# Patient Record
Sex: Female | Born: 1945 | Race: White | Hispanic: No | Smoking: Never smoker
Health system: Southern US, Community
[De-identification: ages and names within clinical notes are randomized; demographics above are authoritative.]

## PROBLEM LIST (undated history)

## (undated) DIAGNOSIS — I10 Essential (primary) hypertension: Secondary | ICD-10-CM

## (undated) DIAGNOSIS — N1832 Chronic kidney disease, stage 3b: Secondary | ICD-10-CM

## (undated) DIAGNOSIS — R7303 Prediabetes: Secondary | ICD-10-CM

## (undated) DIAGNOSIS — I509 Heart failure, unspecified: Secondary | ICD-10-CM

## (undated) DIAGNOSIS — E559 Vitamin D deficiency, unspecified: Secondary | ICD-10-CM

## (undated) DIAGNOSIS — N2581 Secondary hyperparathyroidism of renal origin: Secondary | ICD-10-CM

## (undated) DIAGNOSIS — E785 Hyperlipidemia, unspecified: Secondary | ICD-10-CM

## (undated) DIAGNOSIS — I447 Left bundle-branch block, unspecified: Secondary | ICD-10-CM

## (undated) DIAGNOSIS — R739 Hyperglycemia, unspecified: Secondary | ICD-10-CM

## (undated) DIAGNOSIS — I712 Thoracic aortic aneurysm, without rupture, unspecified: Secondary | ICD-10-CM

## (undated) HISTORY — PX: TRANSCATHETER AORTIC VALVE REPLACEMENT, CAROTID: SHX6798

## (undated) HISTORY — PX: INSERT / REPLACE / REMOVE PACEMAKER: SUR710

---

## 2010-07-16 ENCOUNTER — Ambulatory Visit: Payer: Self-pay | Admitting: Unknown Physician Specialty

## 2010-08-22 ENCOUNTER — Ambulatory Visit: Payer: Self-pay | Admitting: Gastroenterology

## 2020-10-24 ENCOUNTER — Other Ambulatory Visit: Payer: Self-pay | Admitting: Internal Medicine

## 2020-10-24 DIAGNOSIS — Z1231 Encounter for screening mammogram for malignant neoplasm of breast: Secondary | ICD-10-CM

## 2020-11-21 ENCOUNTER — Ambulatory Visit
Admission: RE | Admit: 2020-11-21 | Discharge: 2020-11-21 | Disposition: A | Discharge status: Home / Self Care | Payer: Medicare Other | Source: Ambulatory Visit | Attending: Internal Medicine | Admitting: Internal Medicine

## 2020-11-21 ENCOUNTER — Other Ambulatory Visit: Payer: Self-pay

## 2020-11-21 DIAGNOSIS — Z1231 Encounter for screening mammogram for malignant neoplasm of breast: Secondary | ICD-10-CM | POA: Diagnosis present

## 2021-03-29 ENCOUNTER — Other Ambulatory Visit: Payer: Self-pay | Admitting: Internal Medicine

## 2021-03-29 DIAGNOSIS — I712 Thoracic aortic aneurysm, without rupture, unspecified: Secondary | ICD-10-CM

## 2021-04-08 ENCOUNTER — Ambulatory Visit: Admission: RE | Admit: 2021-04-08 | Payer: Medicare Other | Source: Ambulatory Visit

## 2021-05-15 ENCOUNTER — Other Ambulatory Visit: Payer: Self-pay

## 2021-05-15 ENCOUNTER — Ambulatory Visit
Admission: RE | Admit: 2021-05-15 | Discharge: 2021-05-15 | Disposition: A | Discharge status: Home / Self Care | Payer: Medicare Other | Source: Ambulatory Visit | Attending: Internal Medicine | Admitting: Internal Medicine

## 2021-05-15 DIAGNOSIS — I712 Thoracic aortic aneurysm, without rupture, unspecified: Secondary | ICD-10-CM | POA: Diagnosis present

## 2021-05-15 LAB — POCT I-STAT CREATININE: Creatinine, Ser: 1.3 mg/dL — ABNORMAL HIGH (ref 0.44–1.00)

## 2021-05-15 MED ORDER — IOHEXOL 350 MG/ML SOLN
75.0000 mL | Freq: Once | INTRAVENOUS | Status: AC | PRN
  Administered 2021-05-15: 75 mL via INTRAVENOUS

## 2021-10-21 ENCOUNTER — Other Ambulatory Visit: Payer: Self-pay | Admitting: Internal Medicine

## 2021-10-21 DIAGNOSIS — Z1231 Encounter for screening mammogram for malignant neoplasm of breast: Secondary | ICD-10-CM

## 2021-12-12 ENCOUNTER — Ambulatory Visit
Admission: RE | Admit: 2021-12-12 | Discharge: 2021-12-12 | Disposition: A | Discharge status: Home / Self Care | Payer: Medicare Other | Source: Ambulatory Visit | Attending: Internal Medicine | Admitting: Internal Medicine

## 2021-12-12 DIAGNOSIS — Z1231 Encounter for screening mammogram for malignant neoplasm of breast: Secondary | ICD-10-CM | POA: Insufficient documentation

## 2022-05-29 ENCOUNTER — Other Ambulatory Visit: Payer: Self-pay | Admitting: Student

## 2022-05-29 DIAGNOSIS — M48061 Spinal stenosis, lumbar region without neurogenic claudication: Secondary | ICD-10-CM

## 2022-05-29 DIAGNOSIS — M5416 Radiculopathy, lumbar region: Secondary | ICD-10-CM

## 2022-05-29 DIAGNOSIS — M4716 Other spondylosis with myelopathy, lumbar region: Secondary | ICD-10-CM

## 2022-06-19 ENCOUNTER — Other Ambulatory Visit: Payer: Self-pay | Admitting: Student

## 2022-06-19 DIAGNOSIS — M5416 Radiculopathy, lumbar region: Secondary | ICD-10-CM

## 2022-06-19 DIAGNOSIS — M4807 Spinal stenosis, lumbosacral region: Secondary | ICD-10-CM

## 2022-06-19 DIAGNOSIS — M47816 Spondylosis without myelopathy or radiculopathy, lumbar region: Secondary | ICD-10-CM

## 2022-06-26 ENCOUNTER — Other Ambulatory Visit: Payer: Medicare Other

## 2022-07-11 ENCOUNTER — Ambulatory Visit
Admission: RE | Admit: 2022-07-11 | Discharge: 2022-07-11 | Disposition: A | Discharge status: Home / Self Care | Payer: Medicare Other | Source: Ambulatory Visit | Attending: Student | Admitting: Student

## 2022-07-11 DIAGNOSIS — M5416 Radiculopathy, lumbar region: Secondary | ICD-10-CM

## 2022-07-11 DIAGNOSIS — M47816 Spondylosis without myelopathy or radiculopathy, lumbar region: Secondary | ICD-10-CM

## 2022-07-11 DIAGNOSIS — M4807 Spinal stenosis, lumbosacral region: Secondary | ICD-10-CM

## 2022-07-16 ENCOUNTER — Other Ambulatory Visit: Payer: Self-pay | Admitting: Internal Medicine

## 2022-07-16 DIAGNOSIS — I7121 Aneurysm of the ascending aorta, without rupture: Secondary | ICD-10-CM

## 2022-07-30 ENCOUNTER — Other Ambulatory Visit: Payer: Self-pay | Admitting: Internal Medicine

## 2022-07-30 DIAGNOSIS — R079 Chest pain, unspecified: Secondary | ICD-10-CM

## 2022-07-31 ENCOUNTER — Ambulatory Visit: Admission: RE | Admit: 2022-07-31 | Payer: Medicare Other | Source: Ambulatory Visit

## 2022-08-04 ENCOUNTER — Other Ambulatory Visit: Payer: Self-pay | Admitting: Internal Medicine

## 2022-08-04 DIAGNOSIS — I7121 Aneurysm of the ascending aorta, without rupture: Secondary | ICD-10-CM

## 2022-08-06 ENCOUNTER — Ambulatory Visit
Admission: RE | Admit: 2022-08-06 | Discharge: 2022-08-06 | Disposition: A | Discharge status: Home / Self Care | Payer: Medicare Other | Source: Ambulatory Visit | Attending: Internal Medicine | Admitting: Internal Medicine

## 2022-08-06 DIAGNOSIS — I7121 Aneurysm of the ascending aorta, without rupture: Secondary | ICD-10-CM | POA: Insufficient documentation

## 2022-08-06 LAB — POCT I-STAT CREATININE: Creatinine, Ser: 1.4 mg/dL — ABNORMAL HIGH (ref 0.44–1.00)

## 2022-08-06 MED ORDER — IOHEXOL 350 MG/ML SOLN
75.0000 mL | Freq: Once | INTRAVENOUS | Status: AC | PRN
  Administered 2022-08-06: 60 mL via INTRAVENOUS

## 2022-08-07 ENCOUNTER — Inpatient Hospital Stay: Admission: RE | Admit: 2022-08-07 | Payer: Medicare Other | Source: Ambulatory Visit

## 2022-08-29 ENCOUNTER — Other Ambulatory Visit: Payer: Self-pay | Admitting: Internal Medicine

## 2022-08-29 DIAGNOSIS — I059 Rheumatic mitral valve disease, unspecified: Secondary | ICD-10-CM

## 2022-09-02 ENCOUNTER — Ambulatory Visit
Admission: RE | Admit: 2022-09-02 | Discharge: 2022-09-02 | Disposition: A | Discharge status: Home / Self Care | Payer: Medicare Other | Source: Ambulatory Visit | Attending: Internal Medicine | Admitting: Internal Medicine

## 2022-09-02 DIAGNOSIS — I059 Rheumatic mitral valve disease, unspecified: Secondary | ICD-10-CM | POA: Insufficient documentation

## 2022-09-10 ENCOUNTER — Encounter: Payer: Self-pay | Admitting: Internal Medicine

## 2022-09-16 ENCOUNTER — Encounter: Payer: Self-pay | Admitting: Internal Medicine

## 2022-09-17 ENCOUNTER — Encounter
Admission: RE | Disposition: A | Discharge status: Home / Self Care | Payer: Self-pay | Source: Home / Self Care | Attending: Internal Medicine

## 2022-09-17 ENCOUNTER — Other Ambulatory Visit: Payer: Self-pay

## 2022-09-17 ENCOUNTER — Ambulatory Visit
Admission: RE | Admit: 2022-09-17 | Discharge: 2022-09-17 | Disposition: A | Discharge status: Home / Self Care | Payer: Medicare Other | Attending: Internal Medicine | Admitting: Internal Medicine

## 2022-09-17 ENCOUNTER — Ambulatory Visit: Payer: Medicare Other | Admitting: Certified Registered"

## 2022-09-17 ENCOUNTER — Encounter: Payer: Self-pay | Admitting: Internal Medicine

## 2022-09-17 DIAGNOSIS — K5730 Diverticulosis of large intestine without perforation or abscess without bleeding: Secondary | ICD-10-CM | POA: Insufficient documentation

## 2022-09-17 DIAGNOSIS — D120 Benign neoplasm of cecum: Secondary | ICD-10-CM | POA: Diagnosis not present

## 2022-09-17 DIAGNOSIS — D128 Benign neoplasm of rectum: Secondary | ICD-10-CM | POA: Insufficient documentation

## 2022-09-17 DIAGNOSIS — N1832 Chronic kidney disease, stage 3b: Secondary | ICD-10-CM | POA: Diagnosis not present

## 2022-09-17 DIAGNOSIS — I509 Heart failure, unspecified: Secondary | ICD-10-CM | POA: Insufficient documentation

## 2022-09-17 DIAGNOSIS — Z79899 Other long term (current) drug therapy: Secondary | ICD-10-CM | POA: Insufficient documentation

## 2022-09-17 DIAGNOSIS — E785 Hyperlipidemia, unspecified: Secondary | ICD-10-CM | POA: Insufficient documentation

## 2022-09-17 DIAGNOSIS — N2581 Secondary hyperparathyroidism of renal origin: Secondary | ICD-10-CM | POA: Diagnosis not present

## 2022-09-17 DIAGNOSIS — Z86010 Personal history of colonic polyps: Secondary | ICD-10-CM | POA: Insufficient documentation

## 2022-09-17 DIAGNOSIS — I130 Hypertensive heart and chronic kidney disease with heart failure and stage 1 through stage 4 chronic kidney disease, or unspecified chronic kidney disease: Secondary | ICD-10-CM | POA: Insufficient documentation

## 2022-09-17 DIAGNOSIS — Z09 Encounter for follow-up examination after completed treatment for conditions other than malignant neoplasm: Secondary | ICD-10-CM | POA: Diagnosis not present

## 2022-09-17 DIAGNOSIS — Z1211 Encounter for screening for malignant neoplasm of colon: Secondary | ICD-10-CM | POA: Diagnosis present

## 2022-09-17 HISTORY — DX: Hyperglycemia, unspecified: R73.9

## 2022-09-17 HISTORY — DX: Prediabetes: R73.03

## 2022-09-17 HISTORY — DX: Left bundle-branch block, unspecified: I44.7

## 2022-09-17 HISTORY — DX: Heart failure, unspecified: I50.9

## 2022-09-17 HISTORY — DX: Hyperlipidemia, unspecified: E78.5

## 2022-09-17 HISTORY — DX: Thoracic aortic aneurysm, without rupture, unspecified: I71.20

## 2022-09-17 HISTORY — PX: COLONOSCOPY: SHX5424

## 2022-09-17 HISTORY — DX: Chronic kidney disease, stage 3b: N18.32

## 2022-09-17 HISTORY — DX: Essential (primary) hypertension: I10

## 2022-09-17 HISTORY — DX: Vitamin D deficiency, unspecified: E55.9

## 2022-09-17 HISTORY — DX: Secondary hyperparathyroidism of renal origin: N25.81

## 2022-09-17 SURGERY — COLONOSCOPY
Anesthesia: General

## 2022-09-17 MED ORDER — PROPOFOL 10 MG/ML IV BOLUS
INTRAVENOUS | Status: DC | PRN
  Administered 2022-09-17 (×4): 20 mg via INTRAVENOUS
  Administered 2022-09-17: 30 mg via INTRAVENOUS
  Administered 2022-09-17 (×2): 20 mg via INTRAVENOUS
  Administered 2022-09-17: 50 mg via INTRAVENOUS

## 2022-09-17 MED ORDER — LIDOCAINE HCL (PF) 2 % IJ SOLN
INTRAMUSCULAR | Status: DC | PRN
  Administered 2022-09-17: 30 mg via INTRADERMAL

## 2022-09-17 MED ORDER — SODIUM CHLORIDE 0.9 % IV SOLN: INTRAVENOUS | Status: DC

## 2022-09-17 NOTE — Anesthesia Postprocedure Evaluation (Signed)
Anesthesia Post Note  Patient: Courtney Lynch  Procedure(s) Performed: COLONOSCOPY  Patient location during evaluation: Endoscopy Anesthesia Type: General Level of consciousness: awake and alert Pain management: pain level controlled Vital Signs Assessment: post-procedure vital signs reviewed and stable Respiratory status: spontaneous breathing, nonlabored ventilation, respiratory function stable and patient connected to nasal cannula oxygen Cardiovascular status: blood pressure returned to baseline and stable Postop Assessment: no apparent nausea or vomiting Anesthetic complications: no  No notable events documented.   Last Vitals:  Vitals:   09/17/22 1040 09/17/22 1050  BP: 122/79   Pulse: 60 60  Resp:    Temp:    SpO2: 100% 100%    Last Pain:  Vitals:   09/17/22 1050  TempSrc:   PainSc: 0-No pain                 Stephanie Coup

## 2022-09-17 NOTE — H&P (Signed)
Outpatient short stay form Pre-procedure 09/17/2022 9:36 AM Merwin Breden K. Norma Fredrickson, M.D.  Primary Physician: Daniel Nones III, M.D.  Reason for visit:  Personal history of colon polyps  History of present illness:  Ms. Buckholz reports having a colonoscopy about 5-7 years ago in [facility] and being told had pre-cancerous polyps and to have 5 year repeat, due-overdue at this time. She is feeling well from a GI standpoint, she is having a BM daily. She denies any constipation, diarrhea, abd pain, rectal bleeding.    No current facility-administered medications for this encounter.  Medications Prior to Admission  Medication Sig Dispense Refill Last Dose   aspirin EC 81 MG tablet Take 81 mg by mouth daily. Swallow whole.   Past Week   furosemide (LASIX) 20 MG tablet Take 20 mg by mouth.      losartan (COZAAR) 100 MG tablet Take 100 mg by mouth daily.      metoprolol tartrate (LOPRESSOR) 50 MG tablet Take 50 mg by mouth 2 (two) times daily. 50 mg AM 25mg  PM      rosuvastatin (CRESTOR) 40 MG tablet Take 40 mg by mouth daily.        No Known Allergies   Past Medical History:  Diagnosis Date   CHF (congestive heart failure)    Hyperglycemia    Hyperlipidemia    Hypertension    Left bundle branch block    Pre-diabetes    Secondary hyperparathyroidism    Stage 3b chronic kidney disease    Thoracic aortic aneurysm without rupture    Vitamin D deficiency     Review of systems:  Otherwise negative.    Physical Exam  Gen: Alert, oriented. Appears stated age.  HEENT: [place]/AT. PERRLA. Lungs: CTA, no wheezes. CV: RR nl S1, S2. Abd: soft, benign, no masses. BS+ Ext: No edema. Pulses 2+    Planned procedures: Proceed with colonoscopy. The patient understands the nature of the planned procedure, indications, risks, alternatives and potential complications including but not limited to bleeding, infection, perforation, damage to internal organs and possible oversedation/side effects from  anesthesia. The patient agrees and gives consent to proceed.  Please refer to procedure notes for findings, recommendations and patient disposition/instructions.     Kyng Matlock K. Norma Fredrickson, M.D. Gastroenterology 09/17/2022  9:36 AM

## 2022-09-17 NOTE — Transfer of Care (Signed)
Immediate Anesthesia Transfer of Care Note  Patient: Courtney Lynch  Procedure(s) Performed: COLONOSCOPY  Patient Location: PACU and Endoscopy Unit  Anesthesia Type:MAC  Level of Consciousness: awake  Airway & Oxygen Therapy: Patient Spontanous Breathing and Patient connected to nasal cannula oxygen  Post-op Assessment: Report given to RN and Post -op Vital signs reviewed and stable  Post vital signs: Reviewed and stable  Last Vitals:  Vitals Value Taken Time  BP 112/72 09/17/22 1030  Temp    Pulse 61 09/17/22 1030  Resp 17 09/17/22 1030  SpO2 99 % 09/17/22 1030  Vitals shown include unvalidated device data.  Last Pain:  Vitals:   09/17/22 1030  TempSrc: Temporal  PainSc: 0-No pain         Complications: No notable events documented.

## 2022-09-17 NOTE — Interval H&P Note (Signed)
History and Physical Interval Note:  09/17/2022 9:37 AM  Courtney Lynch  has presented today for surgery, with the diagnosis of History of colon polyps (Z86.010).  The various methods of treatment have been discussed with the patient and family. After consideration of risks, benefits and other options for treatment, the patient has consented to  Procedure(s): COLONOSCOPY (N/A) as a surgical intervention.  The patient's history has been reviewed, patient examined, no change in status, stable for surgery.  I have reviewed the patient's chart and labs.  Questions were answered to the patient's satisfaction.     [place], [place]

## 2022-09-17 NOTE — Op Note (Signed)
[facility] Gastroenterology Patient Name: Courtney Lynch Procedure Date: 09/17/2022 9:59 AM MRN: [id number] Account #: [id number] Date of Birth: 29-Dec-1945 Admit Type: Outpatient Age: 77 Room: [facility] ENDO ROOM 2 Gender: Female Note Status: Finalized Instrument Name: Prentice Docker [id number] Procedure:             Colonoscopy Indications:           Surveillance: Personal history of adenomatous polyps                         on last colonoscopy > 3 years ago Providers:             Royce Macadamia K. Pascale Maves MD, MD Medicines:             Propofol per Anesthesia Complications:         No immediate complications. Procedure:             Pre-Anesthesia Assessment:                        - The risks and benefits of the procedure and the                         sedation options and risks were discussed with the                         patient. All questions were answered and informed                         consent was obtained.                        - Patient identification and proposed procedure were                         verified prior to the procedure by the nurse. The                         procedure was verified in the procedure room.                        - ASA Grade Assessment: III - A patient with severe                         systemic disease.                        - After reviewing the risks and benefits, the patient                         was deemed in satisfactory condition to undergo the                         procedure.                        After obtaining informed consent, the colonoscope was                         passed under direct vision. Throughout the procedure,  the patient's blood pressure, pulse, and oxygen                         saturations were monitored continuously. The                         Colonoscope was introduced through the anus and                         advanced to the the cecum, identified by appendiceal                          orifice and ileocecal valve. The colonoscopy was                         performed without difficulty. The patient tolerated                         the procedure well. The quality of the bowel                         preparation was adequate. The ileocecal valve,                         appendiceal orifice, and rectum were photographed. Findings:      The perianal and digital rectal examinations were normal. Pertinent       negatives include normal sphincter tone and no palpable rectal lesions.      Many small-mouthed diverticula were found in the sigmoid colon.      Two sessile polyps were found in the rectum and cecum. The polyps were 3       to 4 mm in size. These polyps were removed with a cold biopsy forceps.       Resection and retrieval were complete.      The exam was otherwise without abnormality. Impression:            - Diverticulosis in the sigmoid colon.                        - Two 3 to 4 mm polyps in the rectum and in the cecum,                         removed with a cold biopsy forceps. Resected and                         retrieved.                        - The examination was otherwise normal. Recommendation:        - Patient has a contact number available for                         emergencies. The signs and symptoms of potential                         delayed complications were discussed with the patient.  Return to normal activities tomorrow. Written                         discharge instructions were provided to the patient.                        - Resume previous diet.                        - Continue present medications.                        - If polyps are benign or adenomatous without                         dysplasia, I will advise NO further colonoscopy due to                         advanced age and/or severe comorbidity.                        - Return to GI office PRN.                        - The findings and  recommendations were discussed with                         the patient. Procedure Code(s):     --- Professional ---                        [id number], Colonoscopy, flexible; with biopsy, single or                         multiple Diagnosis Code(s):     --- Professional ---                        K57.30, Diverticulosis of large intestine without                         perforation or abscess without bleeding                        D12.0, Benign neoplasm of cecum                        D12.8, Benign neoplasm of rectum                        Z86.010, Personal history of colonic polyps CPT copyright 2022 [employer]. All rights reserved. The codes documented in this report are preliminary and upon coder review may  be revised to meet current compliance requirements. Stanton Kidney MD, MD 09/17/2022 10:32:15 AM This report has been signed electronically. Number of Addenda: 0 Note Initiated On: 09/17/2022 9:59 AM Scope Withdrawal Time: 0 hours 5 minutes 15 seconds  Total Procedure Duration: 0 hours 10 minutes 40 seconds  Estimated Blood Loss:  Estimated blood loss: none.      [facility]

## 2022-09-17 NOTE — Anesthesia Preprocedure Evaluation (Signed)
Anesthesia Evaluation  Patient identified by MRN, date of birth, ID band Patient awake    Reviewed: Allergy & Precautions, NPO status , Patient's Chart, lab work & pertinent test results  Airway Mallampati: III  TM Distance: >3 FB Neck ROM: full    Dental  (+) Chipped, Dental Advidsory Given   Pulmonary neg pulmonary ROS, neg shortness of breath   Pulmonary exam normal        Cardiovascular hypertension, +CHF  Normal cardiovascular exam+ dysrhythmias + Valvular Problems/Murmurs AI      Neuro/Psych negative neurological ROS  negative psych ROS   GI/Hepatic negative GI ROS, Neg liver ROS,,,  Endo/Other  negative endocrine ROS    Renal/GU CRFRenal disease  negative genitourinary   Musculoskeletal   Abdominal   Peds  Hematology negative hematology ROS (+)   Anesthesia Other Findings Past Medical History: No date: CHF (congestive heart failure) No date: Hyperglycemia No date: Hyperlipidemia No date: Hypertension No date: Left bundle branch block No date: Pre-diabetes No date: Secondary hyperparathyroidism No date: Stage 3b chronic kidney disease No date: 4.3cm stable ascending thoracic aortic aneurysm without rupture No date: Vitamin D deficiency  Past Surgical History: No date: INSERT / REPLACE / REMOVE PACEMAKER No date: TRANSCATHETER AORTIC VALVE REPLACEMENT, CAROTID     Comment:  x 2  BMI    Body Mass Index: 31.94 kg/m      Reproductive/Obstetrics negative OB ROS                             Anesthesia Physical Anesthesia Plan  ASA: 3  Anesthesia Plan: General   Post-op Pain Management:    Induction: Intravenous  PONV Risk Score and Plan: Ondansetron, Propofol infusion and TIVA  Airway Management Planned: Natural Airway and Nasal Cannula  Additional Equipment:   Intra-op Plan:   Post-operative Plan:   Informed Consent: I have reviewed the patients History and  Physical, chart, labs and discussed the procedure including the risks, benefits and alternatives for the proposed anesthesia with the patient or authorized representative who has indicated his/her understanding and acceptance.     Dental Advisory Given  Plan Discussed with: Anesthesiologist, CRNA and Surgeon  Anesthesia Plan Comments: (Patient consented for risks of anesthesia including but not limited to:  - adverse reactions to medications - risk of airway placement if required - damage to eyes, teeth, lips or other oral mucosa - nerve damage due to positioning  - sore throat or hoarseness - Damage to heart, brain, nerves, lungs, other parts of body or loss of life  Patient voiced understanding.)        Anesthesia Quick Evaluation

## 2022-09-18 ENCOUNTER — Encounter: Payer: Self-pay | Admitting: Internal Medicine

## 2022-09-18 LAB — SURGICAL PATHOLOGY

## 2023-01-26 ENCOUNTER — Other Ambulatory Visit: Payer: Self-pay | Admitting: Internal Medicine

## 2023-01-26 DIAGNOSIS — Z1231 Encounter for screening mammogram for malignant neoplasm of breast: Secondary | ICD-10-CM

## 2023-02-05 ENCOUNTER — Ambulatory Visit
Admission: RE | Admit: 2023-02-05 | Discharge: 2023-02-05 | Disposition: A | Discharge status: Home / Self Care | Payer: Medicare Other | Source: Ambulatory Visit | Attending: Internal Medicine | Admitting: Internal Medicine

## 2023-02-05 DIAGNOSIS — Z1231 Encounter for screening mammogram for malignant neoplasm of breast: Secondary | ICD-10-CM | POA: Insufficient documentation

## 2023-04-28 IMAGING — MG MM DIGITAL SCREENING BILAT W/ TOMO AND CAD
6 of 10 series · 6 of 30 positions shown · non-contrast
Comparison: Previous exam(s).

CLINICAL DATA: Screening.

EXAM:
DIGITAL SCREENING BILATERAL MAMMOGRAM WITH TOMOSYNTHESIS AND CAD
TECHNIQUE: Bilateral screening digital craniocaudal and mediolateral oblique
mammograms were obtained. Bilateral screening digital breast
tomosynthesis was performed. The images were evaluated with
computer-aided detection.

[R MLO synth-2D]
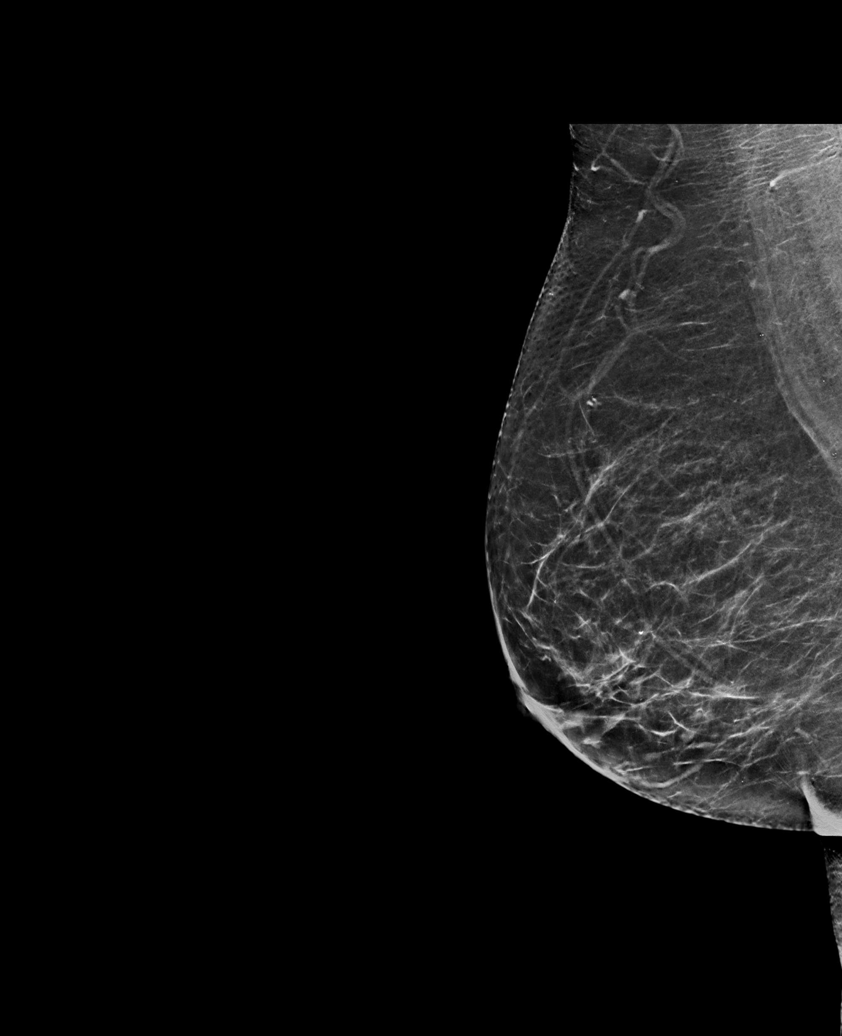

[L MLO synth-2D (1 of 2)]
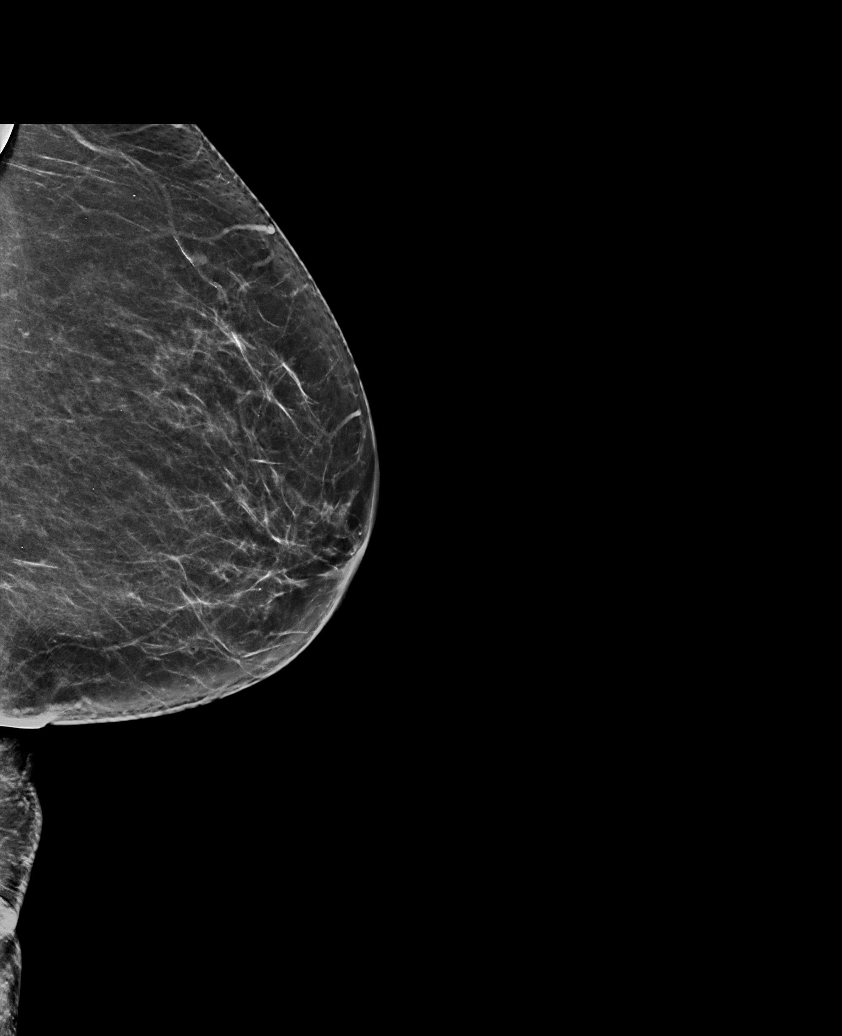

[R CC synth-2D]
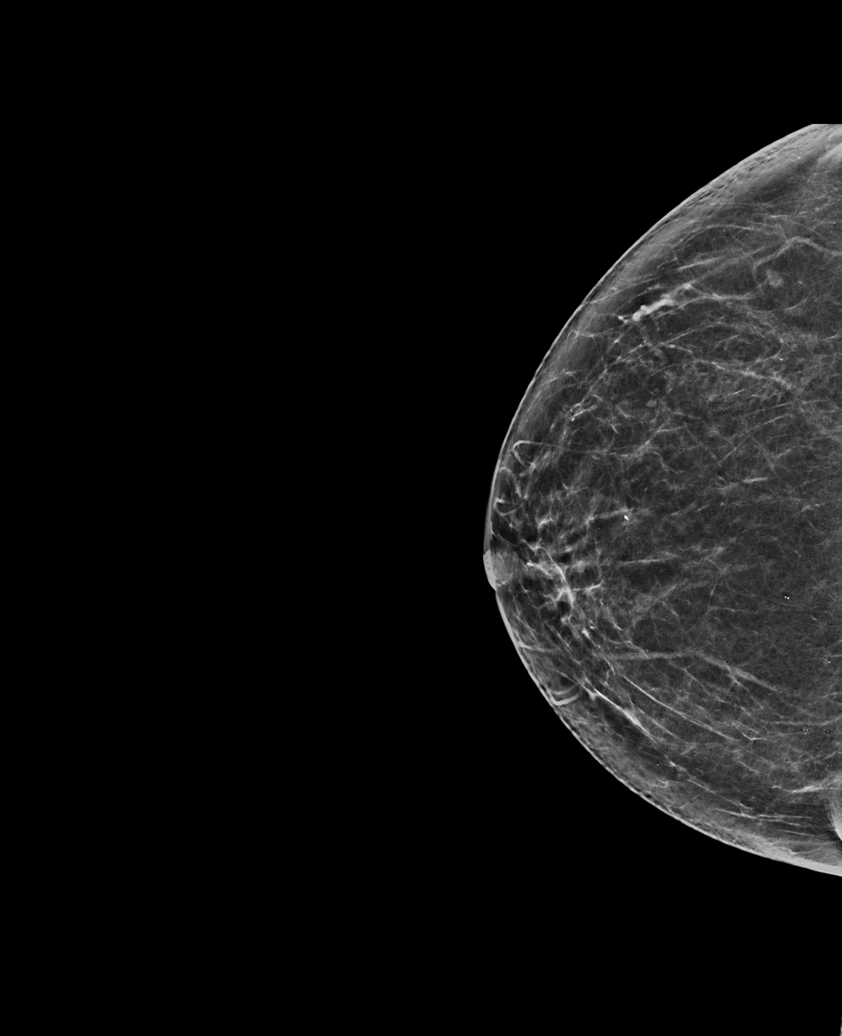

[L MLO synth-2D (2 of 2)]
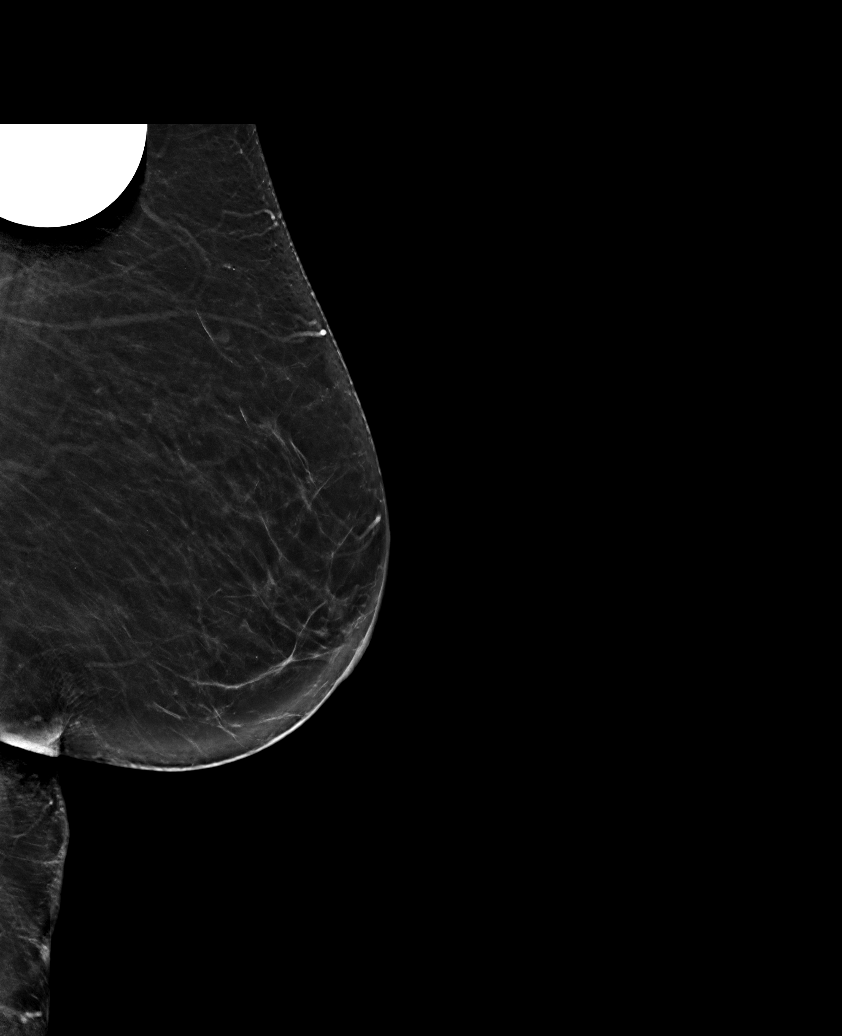

[L CC synth-2D]
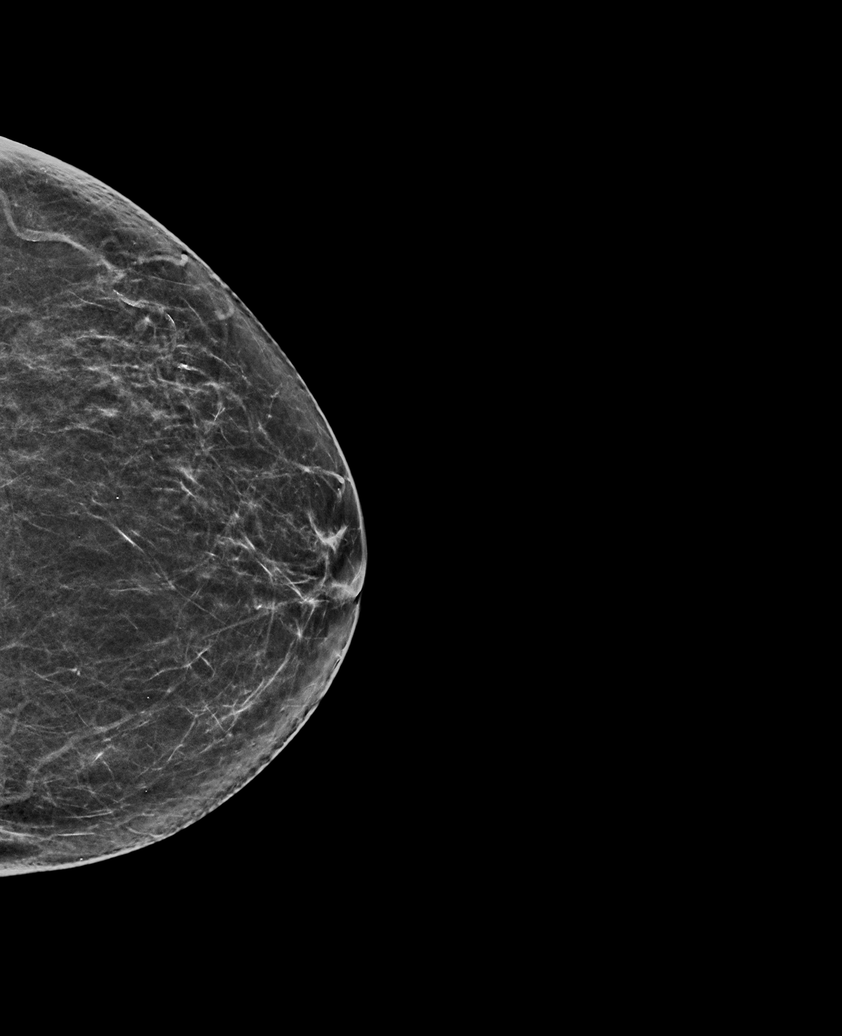

[R MLO tomo · tomo slice 37/73.0]
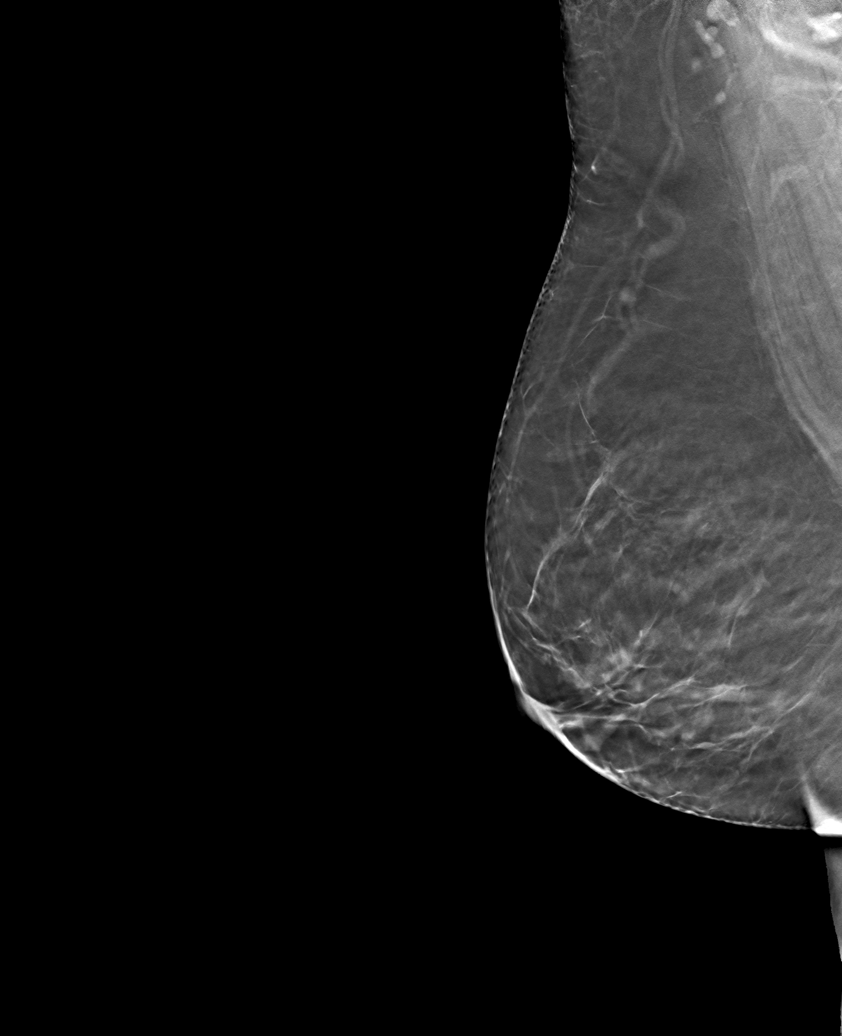

[6 of 30 positions shown; findings below may reference images not displayed]

ACR Breast Density Category b: There are scattered areas of
fibroglandular density.
FINDINGS: There are no findings suspicious for malignancy.
IMPRESSION: No mammographic evidence of malignancy. A result letter of this
screening mammogram will be mailed directly to the patient.

RECOMMENDATION:
Screening mammogram in one year. (Code:51-O-LD2)

BI-RADS CATEGORY  1: Negative.

## 2023-08-12 ENCOUNTER — Other Ambulatory Visit: Payer: Self-pay | Admitting: Nephrology

## 2023-08-12 DIAGNOSIS — E785 Hyperlipidemia, unspecified: Secondary | ICD-10-CM

## 2023-08-12 DIAGNOSIS — I129 Hypertensive chronic kidney disease with stage 1 through stage 4 chronic kidney disease, or unspecified chronic kidney disease: Secondary | ICD-10-CM

## 2023-08-12 DIAGNOSIS — N2581 Secondary hyperparathyroidism of renal origin: Secondary | ICD-10-CM

## 2023-08-12 DIAGNOSIS — I509 Heart failure, unspecified: Secondary | ICD-10-CM

## 2023-08-12 DIAGNOSIS — N1832 Chronic kidney disease, stage 3b: Secondary | ICD-10-CM

## 2023-08-12 DIAGNOSIS — R8290 Unspecified abnormal findings in urine: Secondary | ICD-10-CM

## 2023-08-17 ENCOUNTER — Other Ambulatory Visit

## 2023-08-18 ENCOUNTER — Ambulatory Visit

## 2023-08-27 ENCOUNTER — Ambulatory Visit
Admission: RE | Admit: 2023-08-27 | Discharge: 2023-08-27 | Disposition: A | Discharge status: Home / Self Care | Source: Ambulatory Visit | Attending: Nephrology | Admitting: Nephrology

## 2023-08-27 DIAGNOSIS — N2581 Secondary hyperparathyroidism of renal origin: Secondary | ICD-10-CM | POA: Diagnosis present

## 2023-08-27 DIAGNOSIS — E785 Hyperlipidemia, unspecified: Secondary | ICD-10-CM

## 2023-08-27 DIAGNOSIS — N1832 Chronic kidney disease, stage 3b: Secondary | ICD-10-CM | POA: Diagnosis present

## 2023-08-27 DIAGNOSIS — I129 Hypertensive chronic kidney disease with stage 1 through stage 4 chronic kidney disease, or unspecified chronic kidney disease: Secondary | ICD-10-CM | POA: Diagnosis present

## 2023-08-27 DIAGNOSIS — R8290 Unspecified abnormal findings in urine: Secondary | ICD-10-CM

## 2023-08-27 DIAGNOSIS — I509 Heart failure, unspecified: Secondary | ICD-10-CM | POA: Diagnosis present

## 2023-10-20 IMAGING — CT CT ANGIO CHEST
2 of 6 series · 13 of 36 positions shown · IV contrast (omnipaque)
Comparison: None.

CLINICAL DATA: 75-year-old female with history of thoracic aortic
aneurysm. Follow-up study.

EXAM:
CT ANGIOGRAPHY CHEST WITH CONTRAST
TECHNIQUE: Multidetector CT imaging of the chest was performed using the
standard protocol during bolus administration of intravenous
contrast. Multiplanar CT image reconstructions and MIPs were
obtained to evaluate the vascular anatomy.
CONTRAST:  Seventy-five mL Omnipaque 350, intravenous

[Series 5: axial arterial cta thorax 2.00 · axial · arterial · 0.61mm/px · z∈[-1220,-950]mm · 12 of 161 slices shown]
[im 13/161  lung]
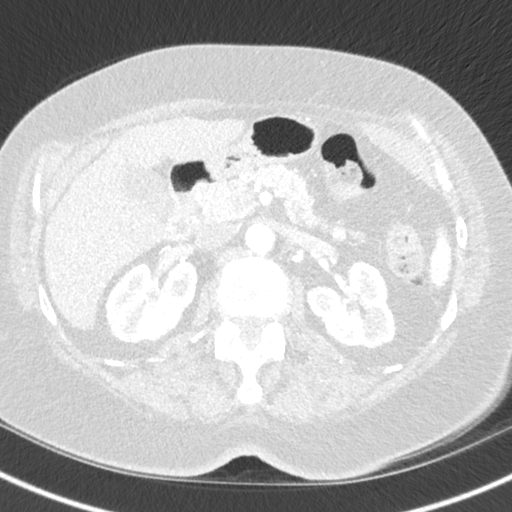
[im 25/161  mediastinal]
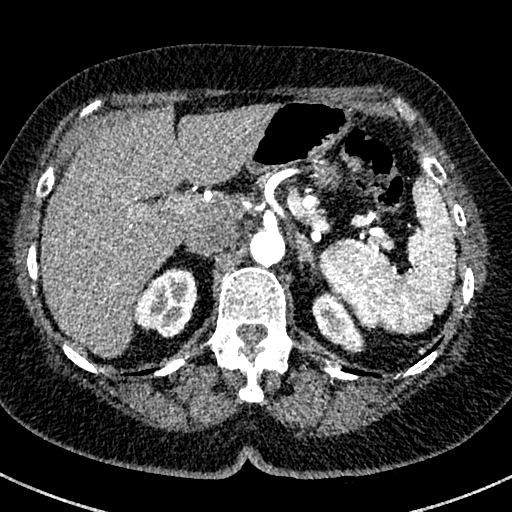
[im 37/161  lung]
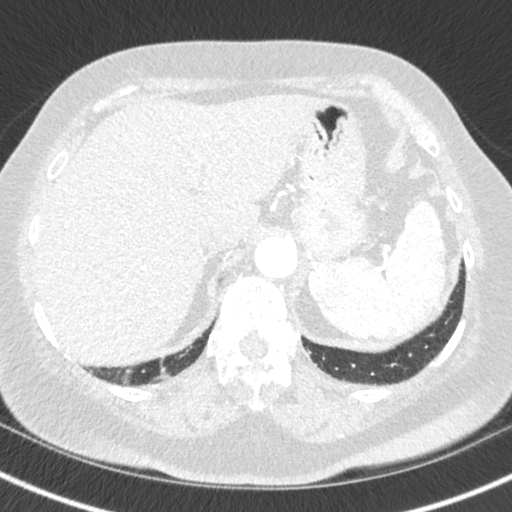
[im 50/161  mediastinal]
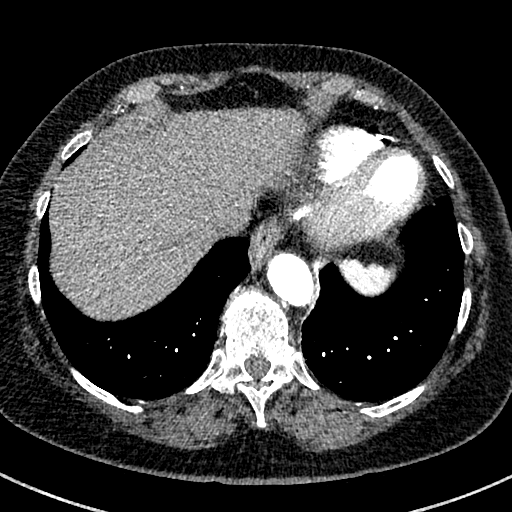
[im 62/161  lung]
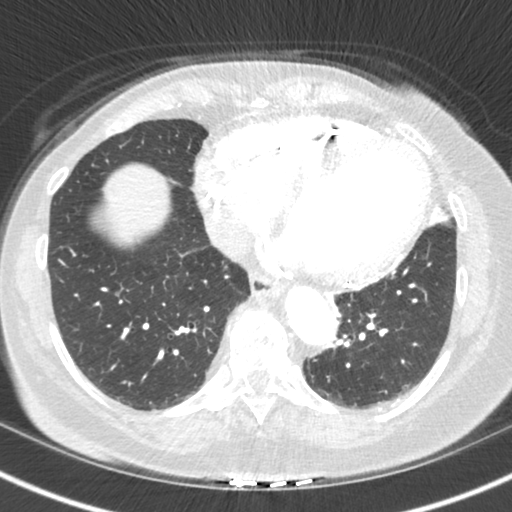
[im 74/161  mediastinal]
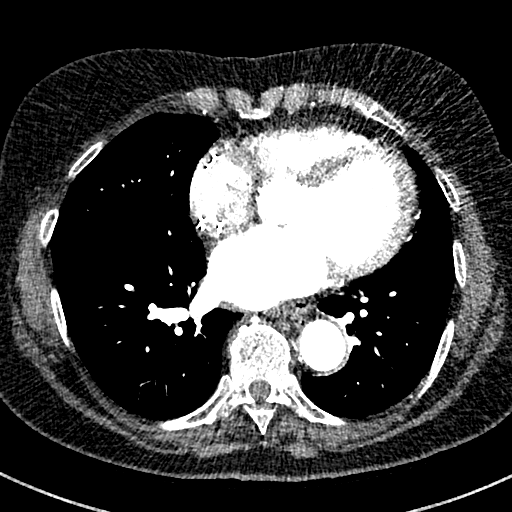
[im 87/161  lung]
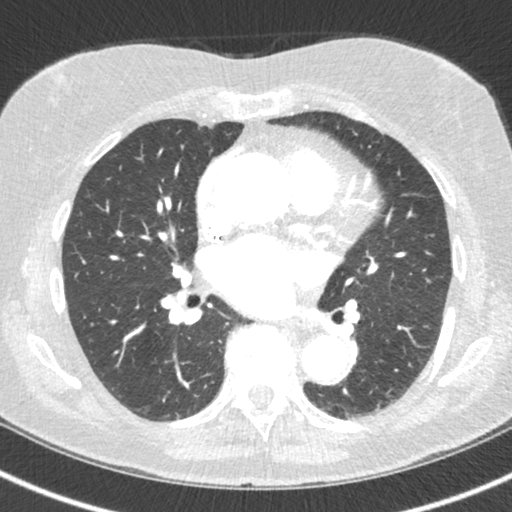
[im 99/161  mediastinal]
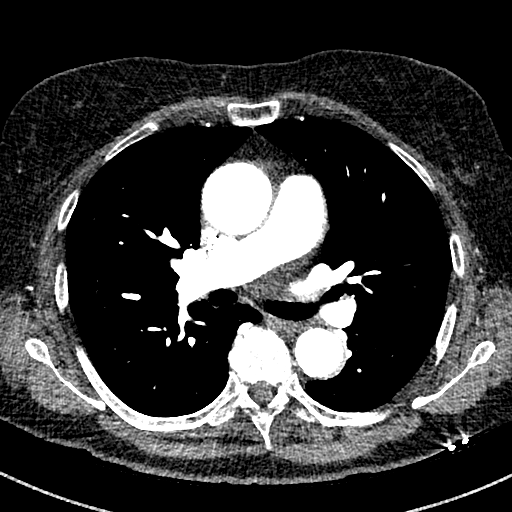
[im 111/161  lung]
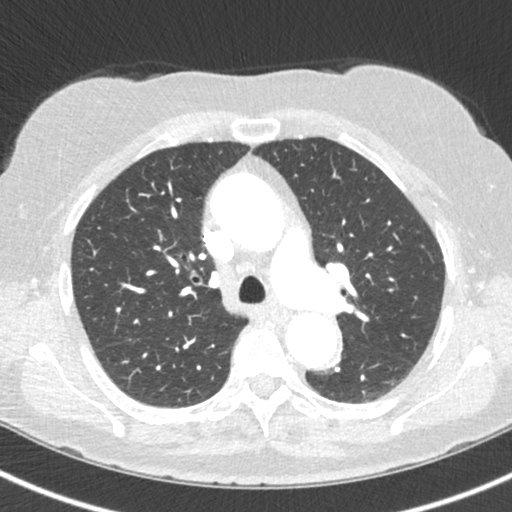
[im 124/161  mediastinal]
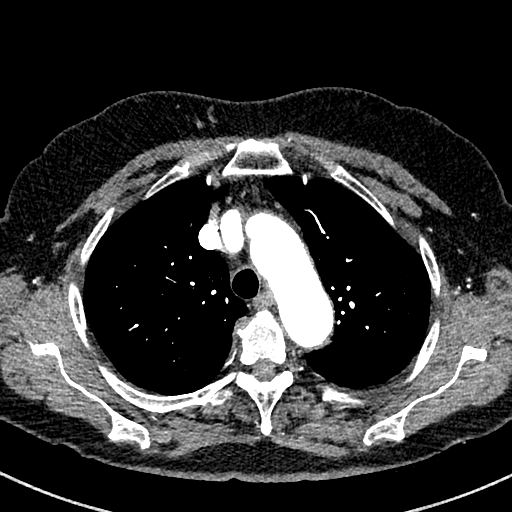
[im 136/161  lung]
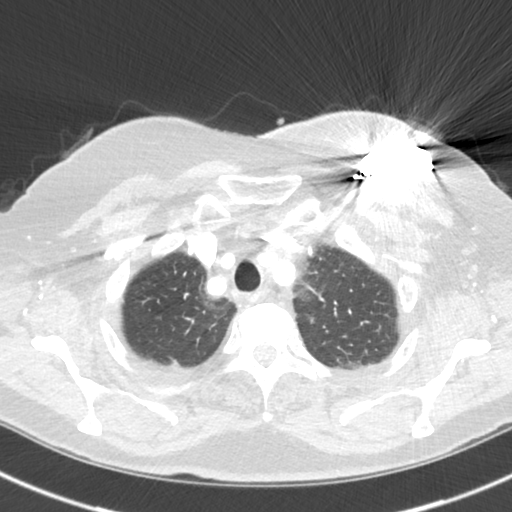
[im 148/161  mediastinal]
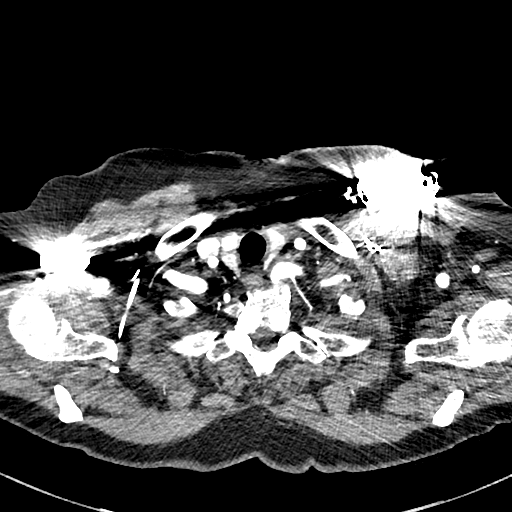

[Series 8: cor st cta thorax 2.00 cor · coronal · 0.61mm/px · 1 of 138 slices shown]
[im 69/138  mediastinal]
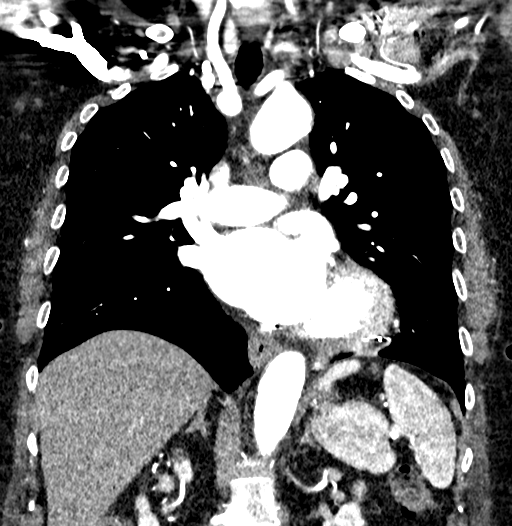

[13 of 36 positions shown; findings below may reference images not displayed]

FINDINGS: Cardiovascular: Preferential opacification of the thoracic aorta. No
evidence of thoracic aortic dissection. Mild scattered
atherosclerotic calcifications. Moderate global cardiomegaly.
Biventricular, left subclavian approach pacemaker in place with
leads in appropriate positions. No pericardial effusion.

Sinues of Valsalva: 30 mm 30 x 34 mm Status post TAVR.

Sinotubular Junction: 32 mm

Ascending Aorta: 43 mm

Aortic Arch: 35 mm

Descending aorta: 28 mm at the level of the carina

Branch vessels: Conventional branching pattern. No significant
atherosclerotic changes.

Coronary arteries: Normal origins and courses. No significant
atherosclerotic calcifications.

Main pulmonary artery: 32 mm. No evidence of central pulmonary
embolism. The left main pulmonary artery measures up to 31 mm.

Pulmonary veins: No anomalous pulmonary venous return. No evidence
of left atrial appendage thrombus.

Upper abdominal vasculature: Within normal limits.

Mediastinum/Nodes: No enlarged mediastinal, hilar, or axillary lymph
nodes. Thyroid gland, trachea, and esophagus demonstrate no
significant findings.

Lungs/Pleura: No focal consolidations. No suspicious pulmonary
nodules. No pleural effusion or pneumothorax.

Upper Abdomen: The visualized upper abdomen is within normal limits.

Musculoskeletal: Multilevel degenerative changes of the thoracic
spine. No acute osseous abnormality.

Review of the MIP images confirms the above findings.
IMPRESSION: Vascular:

1. Fusiform aneurysmal changes of the ascending thoracic aorta
measuring up to 4.3 cm. Recommend annual imaging followup by CTA or
MRA. This recommendation follows 2535
ACCF/AHA/AATS/ACR/ASA/SCA/EG/JAGODAR/NORMAIZAN/GAZSY Guidelines for the
Diagnosis and Management of Patients with Thoracic Aortic Disease.
Circulation. 2535; 121: [id number]. Aortic aneurysm NOS (BQUNA-IHX.R)
2. Status post TAVR without complicating features.
3. Prominence of the main and left main pulmonary arteries as could
be seen with pulmonary artery hypertension.
4. Moderate global cardiomegaly.
5.  Aortic Atherosclerosis (BQUNA-DFS.S).

Non-Vascular:

No acute intrathoracic abnormality.
